# Patient Record
Sex: Male | Born: 2000 | Race: Black or African American | Hispanic: No | Marital: Single
Health system: Southern US, Community
[De-identification: ages and names within clinical notes are randomized; demographics above are authoritative.]

---
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
## 2022-02-21 ENCOUNTER — Inpatient Hospital Stay (HOSPITAL_COMMUNITY)
Admission: EM | Admit: 2022-02-21 | Discharge: 2022-03-13 | DRG: 085 | Disposition: E | Payer: Medicaid Other | Attending: Surgery | Admitting: Surgery

## 2022-02-21 ENCOUNTER — Emergency Department (HOSPITAL_COMMUNITY): Payer: Medicaid Other

## 2022-02-21 DIAGNOSIS — S0193XA Puncture wound without foreign body of unspecified part of head, initial encounter: Secondary | ICD-10-CM

## 2022-02-21 DIAGNOSIS — S06899A Other specified intracranial injury with loss of consciousness of unspecified duration, initial encounter: Principal | ICD-10-CM

## 2022-02-21 DIAGNOSIS — T1490XA Injury, unspecified, initial encounter: Secondary | ICD-10-CM

## 2022-02-21 DIAGNOSIS — N179 Acute kidney failure, unspecified: Secondary | ICD-10-CM | POA: Diagnosis not present

## 2022-02-21 DIAGNOSIS — Z20822 Contact with and (suspected) exposure to covid-19: Secondary | ICD-10-CM | POA: Diagnosis present

## 2022-02-21 DIAGNOSIS — R402132 Coma scale, eyes open, to sound, at arrival to emergency department: Secondary | ICD-10-CM | POA: Diagnosis present

## 2022-02-21 DIAGNOSIS — R402352 Coma scale, best motor response, localizes pain, at arrival to emergency department: Secondary | ICD-10-CM | POA: Diagnosis present

## 2022-02-21 DIAGNOSIS — S06A1XA Traumatic brain compression with herniation, initial encounter: Secondary | ICD-10-CM | POA: Diagnosis not present

## 2022-02-21 DIAGNOSIS — S065X0A Traumatic subdural hemorrhage without loss of consciousness, initial encounter: Secondary | ICD-10-CM | POA: Diagnosis present

## 2022-02-21 DIAGNOSIS — S061X0A Traumatic cerebral edema without loss of consciousness, initial encounter: Secondary | ICD-10-CM | POA: Diagnosis present

## 2022-02-21 DIAGNOSIS — R402142 Coma scale, eyes open, spontaneous, at arrival to emergency department: Secondary | ICD-10-CM | POA: Diagnosis present

## 2022-02-21 DIAGNOSIS — S0281XB Fracture of other specified skull and facial bones, right side, initial encounter for open fracture: Secondary | ICD-10-CM | POA: Diagnosis present

## 2022-02-21 DIAGNOSIS — R402212 Coma scale, best verbal response, none, at arrival to emergency department: Secondary | ICD-10-CM | POA: Diagnosis present

## 2022-02-21 DIAGNOSIS — Y249XXA Unspecified firearm discharge, undetermined intent, initial encounter: Secondary | ICD-10-CM | POA: Diagnosis present

## 2022-02-21 DIAGNOSIS — S066X0A Traumatic subarachnoid hemorrhage without loss of consciousness, initial encounter: Principal | ICD-10-CM | POA: Diagnosis present

## 2022-02-21 DIAGNOSIS — G9382 Brain death: Secondary | ICD-10-CM | POA: Diagnosis not present

## 2022-02-21 DIAGNOSIS — R001 Bradycardia, unspecified: Secondary | ICD-10-CM | POA: Diagnosis present

## 2022-02-21 DIAGNOSIS — J9601 Acute respiratory failure with hypoxia: Secondary | ICD-10-CM | POA: Diagnosis present

## 2022-02-21 DIAGNOSIS — I959 Hypotension, unspecified: Secondary | ICD-10-CM | POA: Diagnosis not present

## 2022-02-21 DIAGNOSIS — S0184XA Puncture wound with foreign body of other part of head, initial encounter: Secondary | ICD-10-CM | POA: Diagnosis present

## 2022-02-21 LAB — RESP PANEL BY RT-PCR (FLU A&B, COVID) ARPGX2
Influenza A by PCR: NEGATIVE
Influenza B by PCR: NEGATIVE
SARS Coronavirus 2 by RT PCR: NEGATIVE

## 2022-02-21 LAB — I-STAT CHEM 8, ED
BUN: 11 mg/dL (ref 6–20)
Calcium, Ion: 1.09 mmol/L — ABNORMAL LOW (ref 1.15–1.40)
Chloride: 106 mmol/L (ref 98–111)
Creatinine, Ser: 1.2 mg/dL (ref 0.61–1.24)
Glucose, Bld: 113 mg/dL — ABNORMAL HIGH (ref 70–99)
HCT: 41 % (ref 39.0–52.0)
Hemoglobin: 13.9 g/dL (ref 13.0–17.0)
Potassium: 4.8 mmol/L (ref 3.5–5.1)
Sodium: 142 mmol/L (ref 135–145)
TCO2: 24 mmol/L (ref 22–32)

## 2022-02-21 LAB — PROTIME-INR
INR: 1.9 — ABNORMAL HIGH (ref 0.8–1.2)
Prothrombin Time: 22 seconds — ABNORMAL HIGH (ref 11.4–15.2)

## 2022-02-21 LAB — I-STAT ARTERIAL BLOOD GAS, ED
Acid-base deficit: 4 mmol/L — ABNORMAL HIGH (ref 0.0–2.0)
Bicarbonate: 23.7 mmol/L (ref 20.0–28.0)
Calcium, Ion: 1.2 mmol/L (ref 1.15–1.40)
HCT: 39 % (ref 39.0–52.0)
Hemoglobin: 13.3 g/dL (ref 13.0–17.0)
O2 Saturation: 100 %
Patient temperature: 96.6
Potassium: 4.6 mmol/L (ref 3.5–5.1)
Sodium: 142 mmol/L (ref 135–145)
TCO2: 25 mmol/L (ref 22–32)
pCO2 arterial: 48.9 mmHg — ABNORMAL HIGH (ref 32–48)
pH, Arterial: 7.287 — ABNORMAL LOW (ref 7.35–7.45)
pO2, Arterial: 479 mmHg — ABNORMAL HIGH (ref 83–108)

## 2022-02-21 LAB — CBC
HCT: 42.4 % (ref 39.0–52.0)
Hemoglobin: 14.1 g/dL (ref 13.0–17.0)
MCH: 28.8 pg (ref 26.0–34.0)
MCHC: 33.3 g/dL (ref 30.0–36.0)
MCV: 86.7 fL (ref 80.0–100.0)
Platelets: 172 10*3/uL (ref 150–400)
RBC: 4.89 MIL/uL (ref 4.22–5.81)
RDW: 12.5 % (ref 11.5–15.5)
WBC: 13.4 10*3/uL — ABNORMAL HIGH (ref 4.0–10.5)
nRBC: 0 % (ref 0.0–0.2)

## 2022-02-21 LAB — LACTIC ACID, PLASMA: Lactic Acid, Venous: 4.4 mmol/L (ref 0.5–1.9)

## 2022-02-21 LAB — SAMPLE TO BLOOD BANK

## 2022-02-21 LAB — ETHANOL: Alcohol, Ethyl (B): <10 mg/dL (ref <10)

## 2022-02-21 MED ORDER — PANTOPRAZOLE SODIUM 40 MG PO TBEC: 40.0000 mg | DELAYED_RELEASE_TABLET | Freq: Every day | ORAL | Status: DC

## 2022-02-21 MED ORDER — HYDRALAZINE HCL 20 MG/ML IJ SOLN
10.0000 mg | INTRAMUSCULAR | Status: DC | PRN
  Administered 2022-02-22: 10 mg via INTRAVENOUS
  Filled 2022-02-21: qty 1

## 2022-02-21 MED ORDER — LEVETIRACETAM IN NACL 500 MG/100ML IV SOLN
500.0000 mg | Freq: Two times a day (BID) | INTRAVENOUS | Status: DC
  Filled 2022-02-21: qty 100

## 2022-02-21 MED ORDER — METOPROLOL TARTRATE 5 MG/5ML IV SOLN
5.0000 mg | Freq: Four times a day (QID) | INTRAVENOUS | Status: DC | PRN
  Administered 2022-02-22: 5 mg via INTRAVENOUS

## 2022-02-21 MED ORDER — FENTANYL BOLUS VIA INFUSION
50.0000 ug | INTRAVENOUS | Status: DC | PRN
  Filled 2022-02-21: qty 100

## 2022-02-21 MED ORDER — SODIUM CHLORIDE 0.9 % IV SOLN
2.0000 g | Freq: Every day | INTRAVENOUS | Status: DC
  Administered 2022-02-21: 2 g via INTRAVENOUS
  Filled 2022-02-21: qty 20

## 2022-02-21 MED ORDER — SODIUM CHLORIDE 0.9 % IV SOLN
1000.0000 mg | Freq: Once | INTRAVENOUS | Status: AC
  Administered 2022-02-22: 1000 mg via INTRAVENOUS
  Filled 2022-02-21: qty 20

## 2022-02-21 MED ORDER — FENTANYL CITRATE PF 50 MCG/ML IJ SOSY
PREFILLED_SYRINGE | INTRAMUSCULAR | Status: AC
  Administered 2022-02-21: 50 ug via INTRAVENOUS
  Filled 2022-02-21: qty 1

## 2022-02-21 MED ORDER — ONDANSETRON HCL 4 MG/2ML IJ SOLN
4.0000 mg | Freq: Four times a day (QID) | INTRAMUSCULAR | Status: DC | PRN
  Administered 2022-02-22: 4 mg via INTRAVENOUS

## 2022-02-21 MED ORDER — PROPOFOL 1000 MG/100ML IV EMUL
0.0000 ug/kg/min | INTRAVENOUS | Status: DC
Start: 2022-02-21 — End: 2022-02-21
  Administered 2022-02-21: 40 ug/kg/min via INTRAVENOUS

## 2022-02-21 MED ORDER — DOCUSATE SODIUM 50 MG/5ML PO LIQD
100.0000 mg | Freq: Two times a day (BID) | ORAL | Status: DC
  Administered 2022-02-22: 100 mg
  Filled 2022-02-21: qty 10

## 2022-02-21 MED ORDER — METOPROLOL TARTRATE 5 MG/5ML IV SOLN: 5.0000 mg | Freq: Four times a day (QID) | INTRAVENOUS | Status: DC | PRN

## 2022-02-21 MED ORDER — PROPOFOL 1000 MG/100ML IV EMUL
0.0000 ug/kg/min | INTRAVENOUS | Status: DC
  Administered 2022-02-21: 13.2 ug/kg/min via INTRAVENOUS
  Administered 2022-02-22: 30 ug/kg/min via INTRAVENOUS
  Filled 2022-02-21: qty 100

## 2022-02-21 MED ORDER — SODIUM CHLORIDE 0.9 % IV SOLN: INTRAVENOUS | Status: DC

## 2022-02-21 MED ORDER — FENTANYL CITRATE PF 50 MCG/ML IJ SOSY
50.0000 ug | PREFILLED_SYRINGE | Freq: Once | INTRAMUSCULAR | Status: AC
  Filled 2022-02-21: qty 1

## 2022-02-21 MED ORDER — ONDANSETRON 4 MG PO TBDP: 4.0000 mg | ORAL_TABLET | Freq: Four times a day (QID) | ORAL | Status: DC | PRN

## 2022-02-21 MED ORDER — PANTOPRAZOLE SODIUM 40 MG IV SOLR
40.0000 mg | Freq: Every day | INTRAVENOUS | Status: DC
  Administered 2022-02-22: 40 mg via INTRAVENOUS
  Filled 2022-02-21: qty 10

## 2022-02-21 MED ORDER — FENTANYL CITRATE PF 50 MCG/ML IJ SOSY
50.0000 ug | PREFILLED_SYRINGE | Freq: Once | INTRAMUSCULAR | Status: AC
  Administered 2022-02-21: 50 ug via INTRAVENOUS

## 2022-02-21 MED ORDER — PHENYTOIN SODIUM 50 MG/ML IJ SOLN
100.0000 mg | Freq: Three times a day (TID) | INTRAMUSCULAR | Status: DC
  Administered 2022-02-22 (×2): 100 mg via INTRAVENOUS
  Filled 2022-02-21 (×3): qty 2

## 2022-02-21 MED ORDER — VANCOMYCIN HCL IN DEXTROSE 1-5 GM/200ML-% IV SOLN
1000.0000 mg | Freq: Two times a day (BID) | INTRAVENOUS | Status: DC
  Administered 2022-02-22 (×2): 1000 mg via INTRAVENOUS
  Filled 2022-02-21 (×3): qty 200

## 2022-02-21 MED ORDER — HYDRALAZINE HCL 20 MG/ML IJ SOLN: 10.0000 mg | INTRAMUSCULAR | Status: DC | PRN

## 2022-02-21 MED ORDER — FENTANYL 2500MCG IN NS 250ML (10MCG/ML) PREMIX INFUSION
50.0000 ug/h | INTRAVENOUS | Status: DC
  Administered 2022-02-21: 100 ug/h via INTRAVENOUS
  Administered 2022-02-22: 200 ug/h via INTRAVENOUS
  Filled 2022-02-21: qty 250

## 2022-02-21 NOTE — ED Notes (Signed)
Family is in consult room A.
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
## 2022-02-21 NOTE — Progress Notes (Signed)
Orthopedic Tech Progress Note Patient Details:  Logan Patel [birth year] [id number]  Patient ID: [name], male   DOB: 10-28-2001, 21 y.o.   MRN: [id number] I attended trauma page. Karolee Stamps 02/21/2022, 11:41 PM
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
## 2022-02-22 ENCOUNTER — Inpatient Hospital Stay (HOSPITAL_COMMUNITY): Payer: Medicaid Other

## 2022-02-22 ENCOUNTER — Encounter (HOSPITAL_COMMUNITY): Payer: Self-pay

## 2022-02-22 LAB — POCT I-STAT 7, (LYTES, BLD GAS, ICA,H+H)
Acid-Base Excess: 1 mmol/L (ref 0.0–2.0)
Acid-base deficit: 3 mmol/L — ABNORMAL HIGH (ref 0.0–2.0)
Acid-base deficit: 2 mmol/L (ref 0.0–2.0)
Acid-base deficit: 3 mmol/L — ABNORMAL HIGH (ref 0.0–2.0)
Acid-base deficit: 5 mmol/L — ABNORMAL HIGH (ref 0.0–2.0)
Bicarbonate: 19.2 mmol/L — ABNORMAL LOW (ref 20.0–28.0)
Bicarbonate: 20.9 mmol/L (ref 20.0–28.0)
Bicarbonate: 21.8 mmol/L (ref 20.0–28.0)
Bicarbonate: 21.2 mmol/L (ref 20.0–28.0)
Bicarbonate: 24.3 mmol/L (ref 20.0–28.0)
Calcium, Ion: 1.12 mmol/L — ABNORMAL LOW (ref 1.15–1.40)
Calcium, Ion: 1.16 mmol/L (ref 1.15–1.40)
Calcium, Ion: 1.2 mmol/L (ref 1.15–1.40)
Calcium, Ion: 1.23 mmol/L (ref 1.15–1.40)
Calcium, Ion: 1.3 mmol/L (ref 1.15–1.40)
HCT: 34 % — ABNORMAL LOW (ref 39.0–52.0)
HCT: 32 % — ABNORMAL LOW (ref 39.0–52.0)
HCT: 36 % — ABNORMAL LOW (ref 39.0–52.0)
HCT: 36 % — ABNORMAL LOW (ref 39.0–52.0)
HCT: 38 % — ABNORMAL LOW (ref 39.0–52.0)
Hemoglobin: 11.6 g/dL — ABNORMAL LOW (ref 13.0–17.0)
Hemoglobin: 10.9 g/dL — ABNORMAL LOW (ref 13.0–17.0)
Hemoglobin: 12.2 g/dL — ABNORMAL LOW (ref 13.0–17.0)
Hemoglobin: 12.2 g/dL — ABNORMAL LOW (ref 13.0–17.0)
Hemoglobin: 12.9 g/dL — ABNORMAL LOW (ref 13.0–17.0)
O2 Saturation: 100 %
O2 Saturation: 100 %
O2 Saturation: 99 %
O2 Saturation: 98 %
O2 Saturation: 99 %
Patient temperature: 98.3
Patient temperature: 101.1
Patient temperature: 98.4
Patient temperature: 98.4
Potassium: 3.4 mmol/L — ABNORMAL LOW (ref 3.5–5.1)
Potassium: 3.7 mmol/L (ref 3.5–5.1)
Potassium: 4.3 mmol/L (ref 3.5–5.1)
Potassium: 4.3 mmol/L (ref 3.5–5.1)
Potassium: 4.3 mmol/L (ref 3.5–5.1)
Sodium: 139 mmol/L (ref 135–145)
Sodium: 140 mmol/L (ref 135–145)
Sodium: 144 mmol/L (ref 135–145)
Sodium: 147 mmol/L — ABNORMAL HIGH (ref 135–145)
Sodium: 148 mmol/L — ABNORMAL HIGH (ref 135–145)
TCO2: 20 mmol/L — ABNORMAL LOW (ref 22–32)
TCO2: 22 mmol/L (ref 22–32)
TCO2: 23 mmol/L (ref 22–32)
TCO2: 22 mmol/L (ref 22–32)
TCO2: 26 mmol/L (ref 22–32)
pCO2 arterial: 25.5 mmHg — ABNORMAL LOW (ref 32–48)
pCO2 arterial: 21.2 mmHg — ABNORMAL LOW (ref 32–48)
pCO2 arterial: 34.3 mmHg (ref 32–48)
pCO2 arterial: 33.3 mmHg (ref 32–48)
pCO2 arterial: 65.9 mmHg (ref 32–48)
pH, Arterial: 7.485 — ABNORMAL HIGH (ref 7.35–7.45)
pH, Arterial: 7.601 (ref 7.35–7.45)
pH, Arterial: 7.417 (ref 7.35–7.45)
pH, Arterial: 7.175 — CL (ref 7.35–7.45)
pH, Arterial: 7.411 (ref 7.35–7.45)
pO2, Arterial: 256 mmHg — ABNORMAL HIGH (ref 83–108)
pO2, Arterial: 185 mmHg — ABNORMAL HIGH (ref 83–108)
pO2, Arterial: 153 mmHg — ABNORMAL HIGH (ref 83–108)
pO2, Arterial: 131 mmHg — ABNORMAL HIGH (ref 83–108)
pO2, Arterial: 145 mmHg — ABNORMAL HIGH (ref 83–108)

## 2022-02-22 LAB — URINALYSIS, ROUTINE W REFLEX MICROSCOPIC
Bilirubin Urine: NEGATIVE
Glucose, UA: 50 mg/dL — AB
Ketones, ur: NEGATIVE mg/dL
Leukocytes,Ua: NEGATIVE
Nitrite: NEGATIVE
Protein, ur: >=300 mg/dL — AB
RBC / HPF: >50 RBC/hpf — ABNORMAL HIGH (ref 0–5)
Specific Gravity, Urine: 1.029 (ref 1.005–1.030)
pH: 6 (ref 5.0–8.0)

## 2022-02-22 LAB — COMPREHENSIVE METABOLIC PANEL
ALT: 12 U/L (ref 0–44)
AST: 53 U/L — ABNORMAL HIGH (ref 15–41)
Albumin: 3.3 g/dL — ABNORMAL LOW (ref 3.5–5.0)
Alkaline Phosphatase: 38 U/L (ref 38–126)
Anion gap: 8 (ref 5–15)
BUN: 9 mg/dL (ref 6–20)
CO2: 21 mmol/L — ABNORMAL LOW (ref 22–32)
Calcium: 7.8 mg/dL — ABNORMAL LOW (ref 8.9–10.3)
Chloride: 110 mmol/L (ref 98–111)
Creatinine, Ser: 1.24 mg/dL (ref 0.61–1.24)
GFR, Estimated: >60 mL/min (ref >60)
Glucose, Bld: 116 mg/dL — ABNORMAL HIGH (ref 70–99)
Potassium: 4.7 mmol/L (ref 3.5–5.1)
Sodium: 139 mmol/L (ref 135–145)
Total Bilirubin: 0.8 mg/dL (ref 0.3–1.2)
Total Protein: 5.5 g/dL — ABNORMAL LOW (ref 6.5–8.1)

## 2022-02-22 LAB — CBC
HCT: 34.9 % — ABNORMAL LOW (ref 39.0–52.0)
Hemoglobin: 12.2 g/dL — ABNORMAL LOW (ref 13.0–17.0)
MCH: 28.7 pg (ref 26.0–34.0)
MCHC: 35 g/dL (ref 30.0–36.0)
MCV: 82.1 fL (ref 80.0–100.0)
Platelets: 159 10*3/uL (ref 150–400)
RBC: 4.25 MIL/uL (ref 4.22–5.81)
RDW: 12.6 % (ref 11.5–15.5)
WBC: 13.4 10*3/uL — ABNORMAL HIGH (ref 4.0–10.5)
nRBC: 0 % (ref 0.0–0.2)

## 2022-02-22 LAB — BASIC METABOLIC PANEL
Anion gap: 11 (ref 5–15)
BUN: 13 mg/dL (ref 6–20)
CO2: 20 mmol/L — ABNORMAL LOW (ref 22–32)
Calcium: 7.8 mg/dL — ABNORMAL LOW (ref 8.9–10.3)
Chloride: 111 mmol/L (ref 98–111)
Creatinine, Ser: 1.89 mg/dL — ABNORMAL HIGH (ref 0.61–1.24)
GFR, Estimated: 51 mL/min — ABNORMAL LOW (ref >60)
Glucose, Bld: 118 mg/dL — ABNORMAL HIGH (ref 70–99)
Potassium: 3.6 mmol/L (ref 3.5–5.1)
Sodium: 142 mmol/L (ref 135–145)

## 2022-02-22 LAB — HIV ANTIBODY (ROUTINE TESTING W REFLEX): HIV Screen 4th Generation wRfx: NONREACTIVE

## 2022-02-22 LAB — TRIGLYCERIDES: Triglycerides: 53 mg/dL (ref <150)

## 2022-02-22 LAB — MRSA NEXT GEN BY PCR, NASAL: MRSA by PCR Next Gen: NOT DETECTED

## 2022-02-22 MED ORDER — SODIUM CHLORIDE 0.9 % IV BOLUS
1000.0000 mL | Freq: Once | INTRAVENOUS | Status: AC
  Administered 2022-02-22: 1000 mL via INTRAVENOUS

## 2022-02-22 MED ORDER — ACETAMINOPHEN 500 MG PO TABS
1000.0000 mg | ORAL_TABLET | Freq: Four times a day (QID) | ORAL | Status: DC
  Filled 2022-02-22: qty 2

## 2022-02-22 MED ORDER — ORAL CARE MOUTH RINSE
15.0000 mL | OROMUCOSAL | Status: DC
  Administered 2022-02-22 (×6): 15 mL via OROMUCOSAL

## 2022-02-22 MED ORDER — NOREPINEPHRINE 4 MG/250ML-% IV SOLN
INTRAVENOUS | Status: AC
  Filled 2022-02-22: qty 250

## 2022-02-22 MED ORDER — NOREPINEPHRINE 4 MG/250ML-% IV SOLN
2.0000 ug/min | INTRAVENOUS | Status: DC
  Administered 2022-02-22: 6 ug/min via INTRAVENOUS
  Administered 2022-02-22: 10 ug/min via INTRAVENOUS
  Filled 2022-02-22: qty 250

## 2022-02-22 MED ORDER — CHLORHEXIDINE GLUCONATE CLOTH 2 % EX PADS
6.0000 | MEDICATED_PAD | Freq: Every day | CUTANEOUS | Status: DC
Start: 2022-02-22 — End: 2022-02-23
  Administered 2022-02-22: 6 via TOPICAL

## 2022-02-22 MED ORDER — SODIUM CHLORIDE 0.9 % IV SOLN
250.0000 mL | INTRAVENOUS | Status: DC
Start: 2022-02-22 — End: 2022-02-23

## 2022-02-22 MED ORDER — ONDANSETRON HCL 4 MG/2ML IJ SOLN
INTRAMUSCULAR | Status: AC
  Filled 2022-02-22: qty 2

## 2022-02-22 MED ORDER — CHLORHEXIDINE GLUCONATE 0.12% ORAL RINSE (MEDLINE KIT)
15.0000 mL | Freq: Two times a day (BID) | OROMUCOSAL | Status: DC
  Administered 2022-02-22 (×2): 15 mL via OROMUCOSAL

## 2022-02-22 MED ORDER — NOREPINEPHRINE 4 MG/250ML-% IV SOLN
0.0000 ug/min | INTRAVENOUS | Status: DC
  Administered 2022-02-22: 15 ug/min via INTRAVENOUS
  Administered 2022-02-22: 18 ug/min via INTRAVENOUS
  Filled 2022-02-22 (×2): qty 250

## 2022-02-22 MED ORDER — ACETAMINOPHEN 500 MG PO TABS
1000.0000 mg | ORAL_TABLET | Freq: Four times a day (QID) | ORAL | Status: DC
  Administered 2022-02-22: 1000 mg

## 2022-02-22 NOTE — Procedures (Signed)
 Extubation Procedure Note  Patient Details:   Name: Logan Patel DOB:  MRN: [id number]   Airway Documentation:  Airway (Active)  Secured at (cm) 26 cm 2022/03/16 1958  Measured From Lips March 16, 2022 1958  Secured Location Left 03/16/2022 1958  Secured By [employer] 03/16/22 1958  Tube Holder Repositioned Yes 03/16/2022 1442  Prone position No Mar 16, 2022 1958  Cuff Pressure (cm H2O) Clear OR 27-39 CmH2O 2022/03/16 1102  Site Condition Dry 2022-03-16 1958   Vent end date: (not recorded) Vent end time: (not recorded)   Evaluation  O2 sats: currently acceptable Complications: No apparent complications Patient did tolerate procedure well. Bilateral Breath Sounds: Clear, Diminished     Phill Myron 2022/03/16, 11:58 PM
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
## 2022-02-22 NOTE — ED Notes (Signed)
CSI at pt bedside
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
## 2022-02-22 NOTE — Progress Notes (Signed)
Notified MD of Pt's BP. Pt is currently maxed on Levophed at through PIV. RN given ok for increased dose of levophed through PIV. IV watch in place.

## 2022-02-22 NOTE — Progress Notes (Signed)
 Patient ID: Logan Patel, male   DOB: , 21 y.o.   MRN: [id number] I spoke with his grandmother and another family member at the bedside. We also called his other grandmother. I updated them on the results of the F/C CT head.   Georganna Skeans, MD, MPH, FACS Please use [email or web address] to contact on call provider

## 2022-02-22 NOTE — Progress Notes (Signed)
RT performed Apnea test with Dr. Bedelia Person at bedside.

## 2022-02-22 NOTE — TOC CAGE-AID Note (Signed)
 Transition of Care [facility]) - CAGE-AID Screening   Patient Details  Name: Logan Patel MRN: [id number] Date of Birth:      Judie Bonus, RN Phone Number: 2022/02/23, 6:17 AM   Clinical Narrative: Pt unable to participate d/t decreased GCS, sedation/intubation.    CAGE-AID Screening: Substance Abuse Screening unable to be completed due to: : Patient unable to participate

## 2022-02-22 NOTE — Progress Notes (Signed)
Spoke with Guerry Bruin with [facility] ref # [id number] [id number] to update on pt's status.  Neal with [facility] called me back.  I let them know that mother is on her way in from [place] and will be here around 7pm.  Also notified them that BD testing will be done around 9pm per Dr. Bobbye Morton.
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
## 2022-02-22 NOTE — Progress Notes (Signed)
RT transported pt to CT and back to 4N26 on full vent support with no complications.
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
## 2022-02-22 NOTE — Progress Notes (Signed)
°   2022-03-03 1415  Clinical Encounter Type  Visited With Patient;Health care provider  Visit Type Initial;Spiritual support  Referral From Chaplain  Consult/Referral To None   Chaplain followed up on a request from prior chaplain. Chaplain prayed for patient and stayed for a bit at the bedside. If family would like to speak with a chaplain please call [place].   Valerie Roys Chaplain Resident  [facility]  [phone]
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
## 2022-02-22 NOTE — Progress Notes (Signed)
An USGPIV (ultrasound guided PIV) has been placed for short-term vasopressor infusion. A correctly placed ivWatch must be used when administering Vasopressors. Should this treatment be needed beyond 72 hours, central line access should be obtained.  It will be the responsibility of the bedside nurse to follow best practice to prevent extravasations.   ?

## 2022-02-22 NOTE — Progress Notes (Signed)
Patient's mother has arrived from [place]. Patient's maternal grandmother is also present. Patient's paternal grandmother is having active health issues and is unable to be present. Clinical update again provided to both and anticipatory guidance provided regarding clinical exam and apnea testing. Patient has been off sedation and has no response to noxious stimulus. He does not have any spontaneous respirations. He does not have a pupillary reflex in either eye. He does not have a gag reflex. He does not have a corneal reflex in either eye. He does not have any response in either eye to cold caloric testing. His eyes remain fixed with lateral head movement in either direction. Clinical exam is consistent with brain death. Family updated and apnea testing performed. Pre-testing ABG performed and patient disconnected from the ventilator. Supplemental O2 provided via tubing down ETT. No spontaneous respirations during the entire 10 minutes of the test. ABG obtained at the conclusion of the 10 min test and CO2 rise of 33 to a level of 66 from 33 pre-test. Apnea test is consistent with brain death. Time of death Mar 29, 2203 and was communicated to the family. All questions answered.   Critical care time: 30min  Jesusita Oka, MD General and [place] [facility]

## 2022-02-22 NOTE — Progress Notes (Addendum)
Patient seen and examined. No brainstem reflexes. CT head reviewed c/w early herniation. Recheck ABG after decrease in RR after 1045 ABG. Hypotensive on 10 of levo, increased to 15 due to SBP 78. Fent gtt turned off just before 1500. After d/w Dr. Annette Stable, I called the patient's mother to provide a clinical update and inform her of plans for brain death testing tonight vs AM. Will refrain from placing CVC/art line due to clinical picture and continue pressors via PIV. Mother driving here from [place], expected arrival ~1830-1900. Will continue escalation of vasopressors until arrival. Discussed code status and futility of chest compressions/defibrillation given his brain injury and probable/impending brain death. She verbalizes understanding, but wishes to discuss with her mother prior to formally changing code status. She reports patient's father is deceased, making her the sole decision-maker for the patient.   Critical care time: 56min  Jesusita Oka, MD General and [place] [facility]
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
## 2022-03-13 DEATH — deceased
